# Patient Record
Sex: Male | Born: 2000 | Race: Black or African American | Hispanic: No | Marital: Single
Health system: Southern US, Community
[De-identification: ages and names within clinical notes are randomized; demographics above are authoritative.]

## PROBLEM LIST (undated history)

## (undated) ENCOUNTER — Emergency Department (HOSPITAL_BASED_OUTPATIENT_CLINIC_OR_DEPARTMENT_OTHER): Admission: EM | Source: Home / Self Care

## (undated) DIAGNOSIS — Z973 Presence of spectacles and contact lenses: Secondary | ICD-10-CM

## (undated) DIAGNOSIS — D649 Anemia, unspecified: Secondary | ICD-10-CM

## (undated) HISTORY — DX: Anemia, unspecified: D64.9

## (undated) HISTORY — DX: Presence of spectacles and contact lenses: Z97.3

---
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
## 2023-05-02 ENCOUNTER — Emergency Department (HOSPITAL_BASED_OUTPATIENT_CLINIC_OR_DEPARTMENT_OTHER): Payer: Commercial Managed Care - PPO

## 2023-05-02 ENCOUNTER — Emergency Department (HOSPITAL_BASED_OUTPATIENT_CLINIC_OR_DEPARTMENT_OTHER)
Admission: EM | Admit: 2023-05-02 | Discharge: 2023-05-03 | Disposition: A | Discharge status: Home / Self Care | Payer: Commercial Managed Care - PPO | Attending: Emergency Medicine | Admitting: Emergency Medicine

## 2023-05-02 ENCOUNTER — Other Ambulatory Visit: Payer: Self-pay

## 2023-05-02 DIAGNOSIS — R7309 Other abnormal glucose: Secondary | ICD-10-CM | POA: Insufficient documentation

## 2023-05-02 DIAGNOSIS — R0602 Shortness of breath: Secondary | ICD-10-CM | POA: Insufficient documentation

## 2023-05-02 DIAGNOSIS — R6883 Chills (without fever): Secondary | ICD-10-CM | POA: Insufficient documentation

## 2023-05-02 DIAGNOSIS — Z1152 Encounter for screening for COVID-19: Secondary | ICD-10-CM | POA: Insufficient documentation

## 2023-05-02 DIAGNOSIS — J029 Acute pharyngitis, unspecified: Secondary | ICD-10-CM | POA: Diagnosis not present

## 2023-05-02 DIAGNOSIS — R079 Chest pain, unspecified: Secondary | ICD-10-CM | POA: Diagnosis not present

## 2023-05-02 DIAGNOSIS — J111 Influenza due to unidentified influenza virus with other respiratory manifestations: Secondary | ICD-10-CM

## 2023-05-02 LAB — CBC
HCT: 41.1 % (ref 39.0–52.0)
Hemoglobin: 13.2 g/dL (ref 13.0–17.0)
MCH: 26.3 pg (ref 26.0–34.0)
MCHC: 32.1 g/dL (ref 30.0–36.0)
MCV: 82 fL (ref 80.0–100.0)
Platelets: 212 10*3/uL (ref 150–400)
RBC: 5.01 MIL/uL (ref 4.22–5.81)
RDW: 12.5 % (ref 11.5–15.5)
WBC: 11.2 10*3/uL — ABNORMAL HIGH (ref 4.0–10.5)
nRBC: 0 % (ref 0.0–0.2)

## 2023-05-02 LAB — BASIC METABOLIC PANEL
Anion gap: 8 (ref 5–15)
BUN: 8 mg/dL (ref 6–20)
CO2: 26 mmol/L (ref 22–32)
Calcium: 9.4 mg/dL (ref 8.9–10.3)
Chloride: 103 mmol/L (ref 98–111)
Creatinine, Ser: 1.02 mg/dL (ref 0.61–1.24)
GFR, Estimated: >60 mL/min (ref >60)
Glucose, Bld: 114 mg/dL — ABNORMAL HIGH (ref 70–99)
Potassium: 3.5 mmol/L (ref 3.5–5.1)
Sodium: 137 mmol/L (ref 135–145)

## 2023-05-02 NOTE — Progress Notes (Signed)
Patient seen in triage. BBS CTA with NAD. Patient complains of diaphragmatic pain with deep breathing. Denies recent strenuous activity. Non smoker. Denies current or past vape history. VSS. CXR in process.

## 2023-05-02 NOTE — ED Triage Notes (Addendum)
Reports tightness in chest that makes it difficult to breathe starting this AM. Reports HA and intermittent numbness in both hands. Denies strenuous exercise or trauma. HX hyperlipidemia and low vitamin D.

## 2023-05-03 LAB — RESP PANEL BY RT-PCR (RSV, FLU A&B, COVID)  RVPGX2
Influenza A by PCR: NEGATIVE
Influenza B by PCR: NEGATIVE
Resp Syncytial Virus by PCR: NEGATIVE
SARS Coronavirus 2 by RT PCR: NEGATIVE

## 2023-05-03 LAB — GROUP A STREP BY PCR: Group A Strep by PCR: NOT DETECTED

## 2023-05-03 MED ORDER — ACETAMINOPHEN 325 MG PO TABS
650.0000 mg | ORAL_TABLET | Freq: Once | ORAL | Status: AC
  Administered 2023-05-03: 650 mg via ORAL
  Filled 2023-05-03: qty 2

## 2023-05-03 MED ORDER — DEXAMETHASONE 4 MG PO TABS
10.0000 mg | ORAL_TABLET | Freq: Once | ORAL | Status: AC
  Administered 2023-05-03: 10 mg via ORAL
  Filled 2023-05-03: qty 3

## 2023-05-03 MED ORDER — IBUPROFEN 400 MG PO TABS
400.0000 mg | ORAL_TABLET | Freq: Once | ORAL | Status: AC
  Administered 2023-05-03: 400 mg via ORAL
  Filled 2023-05-03: qty 1

## 2023-05-03 NOTE — Discharge Instructions (Signed)
Drink plenty of fluids.  Take ibuprofen and/or acetaminophen as needed for pain.  Please be aware that if you can combine ibuprofen and acetaminophen, you will get better pain relief and you get from taking either medication by itself.

## 2023-05-03 NOTE — ED Provider Notes (Signed)
[place] [facility]PARTMENT AT [facility] Provider Note   CSN: [id number] Arrival date & time: 05/02/23  2028     History  Chief Complaint  Patient presents with   Shortness of Breath    David Marsh is a 22 y.o. male.  The history is provided by the patient.  Shortness of Breath He woke up this morning and noted a sore throat and some pain across his lower chest which would hurt when he took his breath or if he twisted or turned.  He denies any actual dyspnea.  There has been minimal cough.  He has had some chills but no fever or sweats.  He is also complaining of some aching in his legs.  He had been at a party and ate dinner over the last 3 days but there were no known definite sick contacts.   Home Medications Prior to Admission medications   Medication Sig Start Date End Date Taking? Authorizing Provider  HYDROcodone-acetaminophen (HYCET) 7.5-325 mg/15 ml solution Take 10 mLs by mouth every 4 (four) hours as needed for moderate pain. 09/02/16   Sheral Apley, MD  ondansetron (ZOFRAN ODT) 4 MG disintegrating tablet Take 1 tablet (4 mg total) by mouth every 8 (eight) hours as needed for nausea or vomiting. 09/02/16   Sheral Apley, MD      Allergies    Amoxicillin    Review of Systems   Review of Systems  Respiratory:  Positive for shortness of breath.   All other systems reviewed and are negative.   Physical Exam Updated Vital Signs BP 119/75 (BP Location: Left Arm)   Pulse (!) 101   Temp 98 F (36.7 C) (Oral)   Resp 16   SpO2 98%  Physical Exam Vitals and nursing note reviewed.   22 year old male, resting comfortably and in no acute distress. Vital signs are significant for borderline elevated heart rate. Oxygen saturation is 98%, which is normal. Head is normocephalic and atraumatic. PERRLA, EOMI. Oropharynx is mildly erythematous.  There is no pooling of secretions and phonation is normal. Neck is nontender and supple without  adenopathy. Lungs are clear without rales, wheezes, or rhonchi. Chest is moderately tender across the lower rib cage anteriorly.  There is no crepitus. Heart has regular rate and rhythm without murmur. Abdomen is soft, flat, nontender. Extremities have no cyanosis or edema, full range of motion is present. Skin is warm and dry without rash. Neurologic: Mental status is normal, cranial nerves are intact, moves all extremities equally.  ED Results / Procedures / Treatments   Labs (all labs ordered are listed, but only abnormal results are displayed) Labs Reviewed  BASIC METABOLIC PANEL - Abnormal; Notable for the following components:      Result Value   Glucose, Bld 114 (*)    All other components within normal limits  CBC - Abnormal; Notable for the following components:   WBC 11.2 (*)    All other components within normal limits  GROUP A STREP BY PCR  RESP PANEL BY RT-PCR (RSV, FLU A&B, COVID)  [id number]    EKG EKG Interpretation  Date/Time:  Tuesday May 02 2023 20:44:30 EDT Ventricular Rate:  98 PR Interval:  144 QRS Duration: 98 QT Interval:  306 QTC Calculation: 390 R Axis:   104 Text Interpretation: Normal sinus rhythm Possible Right ventricular hypertrophy Nonspecific T wave abnormality Abnormal ECG No previous ECGs available Confirmed by Ernie Avena ([id number]) on 05/02/2023 11:00:12 PM  Radiology DG Chest  Portable 1 View  Result Date: 05/02/2023 CLINICAL DATA:  Shortness of breath. Chest tightness. EXAM: PORTABLE CHEST 1 VIEW COMPARISON:  None Available. FINDINGS: The cardiomediastinal contours are normal. The lungs are clear. Pulmonary vasculature is normal. No consolidation, pleural effusion, or pneumothorax. No acute osseous abnormalities are seen. IMPRESSION: Negative AP view of the chest. Electronically Signed   By: Narda Rutherford M.D.   On: 05/02/2023 21:08    Procedures Procedures    Medications Ordered in ED Medications  dexamethasone (DECADRON) tablet 10 mg  (has no administration in time range)  acetaminophen (TYLENOL) tablet 650 mg (has no administration in time range)  ibuprofen (ADVIL) tablet 400 mg (has no administration in time range)    ED Course/ Medical Decision Making/ A&P                             Medical Decision Making Amount and/or Complexity of Data Reviewed Labs: ordered. Radiology: ordered.  Risk OTC drugs. Prescription drug management.   Sore throat, cough, chest pain, body aches, chills suggestive of viral illness.  Consider streptococcal pharyngitis.  Consider influenza, COVID-19, other viral illnesses.  I have low index of suspicion for coronary artery disease, pulmonary embolism, pneumonia.  I have reviewed and interpreted his electrocardiogram and my interpretation is nonspecific T wave flattening.  Chest x-ray shows no acute process.  Have independently viewed the image, and agree with the radiologist's interpretation.  I have reviewed and interpreted his laboratory tests and my interpretation is borderline leukocytosis which is nonspecific, elevated random glucose level which will need to be followed as an outpatient.  I have ordered PCR testing for strep and respiratory pathogen's.  I have ordered a dose of dexamethasone as well as acetaminophen and ibuprofen.  PCR test for group A strep, RSV, influenza, COVID-19 are all negative.  He did note significant improvement with above-noted treatment.  I am discharging him with instructions to drink plenty of fluids and use over-the-counter NSAIDs and acetaminophen as needed for pain.  Return if symptoms worsen.  Final Clinical Impression(s) / ED Diagnoses Final diagnoses:  Influenza-like illness  Elevated random blood glucose level    Rx / DC Orders ED Discharge Orders     None         Dione Booze, MD 05/03/23 [id number]

## 2024-01-16 ENCOUNTER — Ambulatory Visit (HOSPITAL_COMMUNITY): Payer: Self-pay

## 2024-09-08 ENCOUNTER — Other Ambulatory Visit: Payer: Self-pay

## 2024-12-08 ENCOUNTER — Encounter (HOSPITAL_COMMUNITY): Payer: Self-pay

## 2024-12-08 ENCOUNTER — Ambulatory Visit (HOSPITAL_COMMUNITY)
Admission: EM | Admit: 2024-12-08 | Discharge: 2024-12-08 | Disposition: A | Discharge status: Home / Self Care | Payer: Self-pay | Source: Home / Self Care

## 2024-12-08 DIAGNOSIS — R058 Other specified cough: Secondary | ICD-10-CM

## 2024-12-08 DIAGNOSIS — G44209 Tension-type headache, unspecified, not intractable: Secondary | ICD-10-CM

## 2024-12-08 MED ORDER — PROMETHAZINE-DM 6.25-15 MG/5ML PO SYRP: 5.0000 mL | ORAL_SOLUTION | Freq: Every evening | ORAL | 0 refills | Status: AC | PRN

## 2024-12-08 MED ORDER — DEXAMETHASONE SOD PHOSPHATE PF 10 MG/ML IJ SOLN
10.0000 mg | Freq: Once | INTRAMUSCULAR | Status: AC
  Administered 2024-12-08: 10 mg via INTRAMUSCULAR

## 2024-12-08 MED ORDER — KETOROLAC TROMETHAMINE 30 MG/ML IJ SOLN
INTRAMUSCULAR | Status: AC
  Filled 2024-12-08: qty 1

## 2024-12-08 MED ORDER — KETOROLAC TROMETHAMINE 30 MG/ML IJ SOLN
30.0000 mg | Freq: Once | INTRAMUSCULAR | Status: AC
  Administered 2024-12-08: 30 mg via INTRAMUSCULAR

## 2024-12-08 MED ORDER — ALBUTEROL SULFATE HFA 108 (90 BASE) MCG/ACT IN AERS: 1.0000 | INHALATION_SPRAY | Freq: Four times a day (QID) | RESPIRATORY_TRACT | 0 refills | Status: AC | PRN

## 2024-12-08 NOTE — ED Provider Notes (Signed)
 [facility]    CSN: [id number] Arrival date & time: 12/08/24  1519      History   Chief Complaint Chief Complaint  Patient presents with   Headache    HPI David Marsh is a 23 y.o. male.   Patient presents today with a weeklong history of headache.  He reports that the pain is usually around his eyes but will also sometimes involve the entirety of his head, is rated 8 on a 0-10 pain scale, described as sharp, no alleviating factors identified.  He has been taking Tylenol  without improvement of symptoms and his last dose was yesterday at around 6:45 PM.  He has had a mild cough particularly when he first wakes up in the morning but denies any significant congestion, fever, sore throat, postnasal drainage.  Denies any recent antibiotics.  He denies any significant past medical history including allergies, asthma, COPD, smoking.  He denies any recent medication changes or head injury.  He denies personal or family history of primary headache disorder.  He has had intermittent nausea but denies any associated vomiting, worse headache of his life, visual disturbance, dysarthria, focal weakness.    Past Medical History:  Diagnosis Date   Anemia    Wears glasses     Patient Active Problem List   Diagnosis Date Noted   Femur fracture, left (HCC) 09/01/2016    Past Surgical History:  Procedure Laterality Date   FEMUR IM NAIL Left 09/01/2016   Procedure: INTRAMEDULLARY (IM) NAIL FEMORAL LEFT;  Surgeon: Evalene JONETTA Chancy, MD;  Location: [facility] OR;  Service: Orthopedics;  Laterality: Left;       Home Medications    Prior to Admission medications  Medication Sig Start Date End Date Taking? Authorizing Provider  Acetaminophen  (TYLENOL  8 HOUR PO) Take by mouth.   Yes [provider]  albuterol  (VENTOLIN  HFA) 108 (90 Base) MCG/ACT inhaler Inhale 1-2 puffs into the lungs every 6 (six) hours as needed for wheezing or shortness of breath. 12/08/24  Yes Stasia Somero K,  PA-C  promethazine -dextromethorphan (PROMETHAZINE -DM) 6.25-15 MG/5ML syrup Take 5 mLs by mouth at bedtime as needed for cough. 12/08/24  Yes Naysa Puskas, Rocky POUR, PA-C    Family History Family History  Problem Relation Age of Onset   Hypertension Mother    Lupus Mother    Other Father        unknown   Heart disease Maternal Uncle    Heart disease Maternal Grandmother    Heart disease Maternal Grandfather     Social History Social History[1]   Allergies   Amoxicillin    Review of Systems Review of Systems  Constitutional:  Positive for activity change. Negative for appetite change, fatigue and fever.  HENT:  Negative for congestion, sinus pressure, sneezing and sore throat.   Respiratory:  Positive for cough. Negative for shortness of breath.   Cardiovascular:  Negative for chest pain.  Gastrointestinal:  Positive for nausea. Negative for diarrhea and vomiting.  Neurological:  Positive for headaches. Negative for dizziness and light-headedness.     Physical Exam Triage Vital Signs ED Triage Vitals  Encounter Vitals Group     BP 12/08/24 1556 129/73     Girls Systolic BP Percentile --      Girls Diastolic BP Percentile --      Boys Systolic BP Percentile --      Boys Diastolic BP Percentile --      Pulse Rate 12/08/24 1556 87     Resp 12/08/24 1556  19     Temp 12/08/24 1556 97.9 F (36.6 C)     Temp Source 12/08/24 1556 Oral     SpO2 12/08/24 1556 98 %     Weight 12/08/24 1554 220 lb (99.8 kg)     Height 12/08/24 1554 6' 1 (1.854 m)     Head Circumference --      Peak Flow --      Pain Score 12/08/24 1554 8     Pain Loc --      Pain Education --      Exclude from Growth Chart --    No data found.  Updated Vital Signs BP 129/73 (BP Location: Right Arm)   Pulse 87   Temp 97.9 F (36.6 C) (Oral)   Resp 19   Ht 6' 1 (1.854 m)   Wt 220 lb (99.8 kg)   SpO2 98%   BMI 29.03 kg/m   Visual Acuity Right Eye Distance:   Left Eye Distance:   Bilateral  Distance:    Right Eye Near:   Left Eye Near:    Bilateral Near:     Physical Exam Vitals reviewed.  Constitutional:      General: He is awake.     Appearance: Normal appearance. He is well-developed. He is not ill-appearing.     Comments: Very pleasant male appears stated age in no acute distress sitting comfortably in exam room  HENT:     Head: Normocephalic and atraumatic.     Right Ear: Tympanic membrane, ear canal and external ear normal. No hemotympanum.     Left Ear: Tympanic membrane, ear canal and external ear normal. No hemotympanum.     Nose: Nose normal.     Right Sinus: No maxillary sinus tenderness or frontal sinus tenderness.     Left Sinus: No maxillary sinus tenderness or frontal sinus tenderness.     Mouth/Throat:     Pharynx: Uvula midline. Postnasal drip present. No oropharyngeal exudate or posterior oropharyngeal erythema.  Eyes:     Extraocular Movements: Extraocular movements intact.     Conjunctiva/sclera: Conjunctivae normal.     Pupils: Pupils are equal, round, and reactive to light.  Cardiovascular:     Rate and Rhythm: Normal rate and regular rhythm.     Heart sounds: Normal heart sounds, S1 normal and S2 normal. No murmur heard. Pulmonary:     Effort: Pulmonary effort is normal. No accessory muscle usage or respiratory distress.     Breath sounds: Normal breath sounds. No stridor. No wheezing, rhonchi or rales.     Comments: Clear to auscultation bilaterally Musculoskeletal:     Comments: Strength 5/5 bilateral upper and lower extremities  Neurological:     General: No focal deficit present.     Mental Status: He is alert and oriented to person, place, and time.     Cranial Nerves: Cranial nerves 2-12 are intact.     Motor: Motor function is intact.     Coordination: Coordination is intact.     Gait: Gait is intact.     Comments: Cranial nerves II through XII grossly intact.  No focal neurologic defect on exam.  Psychiatric:        Behavior:  Behavior is cooperative.      UC Treatments / Results  Labs (all labs ordered are listed, but only abnormal results are displayed) Labs Reviewed - No data to display  EKG   Radiology No results found.  Procedures Procedures (including critical care time)  Medications Ordered in UC Medications  ketorolac  (TORADOL ) 30 MG/ML injection 30 mg (30 mg Intramuscular Given 12/08/24 1636)  dexamethasone  (DECADRON ) injection 10 mg (10 mg Intramuscular Given 12/08/24 1636)    Initial Impression / Assessment and Plan / UC Course  I have reviewed the triage vital signs and the nursing notes.  Pertinent labs & imaging results that were available during my care of the patient were reviewed by me and considered in my medical decision making (see chart for details).     Patient is well-appearing, afebrile, nontoxic, nontachycardic.  He denies any significant URI symptoms other than persistent cough.  Suspect he is recovering from a viral URI illness but there is no evidence of an acute infection that would warrant initiation of antibiotics.  Viral testing was deferred as he has been symptomatic for a week.  He was given 10 mg of Decadron  and 30 mg of ketorolac  in clinic with significant improvement of symptoms.  Recommended that he push fluids and use acetaminophen  for additional pain relief for the rest of today but then starting tomorrow (24 hours after the ketorolac  dose) he can alternate Tylenol  and ibuprofen  as needed.  I suspect his ongoing cough is related to persistent viral cough syndrome and so he was given albuterol  to have on hand for coughing fits and Promethazine  DM for nocturnal cough.  We discussed that this medication can be sedating and he is not to drive or drink alcohol with taking it.  Chest x-ray was deferred as he no adventitious lung sounds on exam as oxygen saturation was 98%.  We discussed that if his symptoms are not improving within a few days he should return for  reevaluation.  If anything worsens and he has high fever, worsening cough, shortness of breath, severe headache, worsening of his life, visual disturbance he needs to be seen emergently.  Strict turn precautions given.  Excuse note provided.  Patient expressed understanding and agreement to treatment plan.  Final Clinical Impressions(s) / UC Diagnoses   Final diagnoses:  Post-viral cough syndrome  Tension headache     Discharge Instructions      I am glad that you are feeling better after the medication.  Since we gave you an injection of Toradol , please do not take NSAIDs (aspirin, ibuprofen /Advil , naproxen/Aleve) for the next 24 hours.  You can use Tylenol /acetaminophen  as needed.  I have called in an inhaler to have on hand.  If you are having to use this on a regular basis please return for reevaluation.  Take Promethazine  DM for cough.  This should help you sleep but it will make you sleepy so do not drive or drink alcohol with taking it.  If you are not feeling better in 3 to 5 days please return for reevaluation.  If anything worsens you have severe headache, worst headache of your life, worsening cough, shortness of breath, fever, chest pain, weakness you need to be seen immediately.     ED Prescriptions     Medication Sig Dispense Auth. Provider   albuterol  (VENTOLIN  HFA) 108 (90 Base) MCG/ACT inhaler Inhale 1-2 puffs into the lungs every 6 (six) hours as needed for wheezing or shortness of breath. 18 g Asaad Gulley K, PA-C   promethazine -dextromethorphan (PROMETHAZINE -DM) 6.25-15 MG/5ML syrup Take 5 mLs by mouth at bedtime as needed for cough. 35 mL Marios Gaiser K, PA-C      PDMP not reviewed this encounter.     [1]  Social History Tobacco Use  Smoking status: Passive Smoke Exposure - Never Smoker   Smokeless tobacco: Never   Tobacco comments:    mother smokes  Vaping Use   Vaping status: Never Used  Substance Use Topics   Alcohol use: Yes    Comment: occ   Drug  use: No     Sherrell Rocky POUR, PA-C 12/08/24 1731

## 2024-12-08 NOTE — Discharge Instructions (Signed)
 I am glad that you are feeling better after the medication.  Since we gave you an injection of Toradol , please do not take NSAIDs (aspirin, ibuprofen /Advil , naproxen/Aleve) for the next 24 hours.  You can use Tylenol /acetaminophen  as needed.  I have called in an inhaler to have on hand.  If you are having to use this on a regular basis please return for reevaluation.  Take Promethazine  DM for cough.  This should help you sleep but it will make you sleepy so do not drive or drink alcohol with taking it.  If you are not feeling better in 3 to 5 days please return for reevaluation.  If anything worsens you have severe headache, worst headache of your life, worsening cough, shortness of breath, fever, chest pain, weakness you need to be seen immediately.

## 2024-12-08 NOTE — ED Triage Notes (Signed)
 Pt states that he has had a headache x1 week  Pt states that he also has a cough and chest tightness.   Pt states that he has been taking Tylenol .

## 2024-12-10 ENCOUNTER — Encounter (HOSPITAL_BASED_OUTPATIENT_CLINIC_OR_DEPARTMENT_OTHER): Payer: Self-pay | Admitting: *Deleted

## 2024-12-10 ENCOUNTER — Emergency Department (HOSPITAL_BASED_OUTPATIENT_CLINIC_OR_DEPARTMENT_OTHER)
Admission: EM | Admit: 2024-12-10 | Discharge: 2024-12-11 | Disposition: A | Payer: Self-pay | Attending: Emergency Medicine | Admitting: Emergency Medicine

## 2024-12-10 ENCOUNTER — Emergency Department (HOSPITAL_BASED_OUTPATIENT_CLINIC_OR_DEPARTMENT_OTHER): Payer: Self-pay | Admitting: Radiology

## 2024-12-10 ENCOUNTER — Other Ambulatory Visit: Payer: Self-pay

## 2024-12-10 MED ORDER — DIPHENHYDRAMINE HCL 50 MG/ML IJ SOLN
12.5000 mg | Freq: Once | INTRAMUSCULAR | Status: AC
  Administered 2024-12-11: 12.5 mg via INTRAVENOUS
  Filled 2024-12-10: qty 1

## 2024-12-10 MED ORDER — PROCHLORPERAZINE EDISYLATE 10 MG/2ML IJ SOLN
10.0000 mg | Freq: Once | INTRAMUSCULAR | Status: AC
  Administered 2024-12-11: 10 mg via INTRAVENOUS
  Filled 2024-12-10: qty 2

## 2024-12-10 MED ORDER — KETOROLAC TROMETHAMINE 30 MG/ML IJ SOLN
30.0000 mg | Freq: Once | INTRAMUSCULAR | Status: AC
  Administered 2024-12-11: 30 mg via INTRAVENOUS
  Filled 2024-12-10: qty 1

## 2024-12-10 NOTE — ED Triage Notes (Addendum)
 Pt is ambulatory with steady gait, reporting he has had a headache for about a week mostly constant but today it faded away, went to work today and it got worse. Last tylenol  5pm. Coughing at night that wakes (for a year) him up at night and chest tightness (around a year). No n/v, change in vision, or fevers.   Seen at [facility] on Sunday, treated for headache, dx with viral process.

## 2024-12-11 NOTE — Discharge Instructions (Signed)
 You were seen today for headache.  Could be related to some early upper respiratory symptoms or sinus pressure.  Try Flonase or nasal saline.  Continue Tylenol  or ibuprofen  as needed.

## 2024-12-11 NOTE — ED Provider Notes (Signed)
 [facility] Provider Note   CSN: [id number] Arrival date & time: 12/10/24  8079     Patient presents with: Headache   David Marsh is a 23 y.o. male.   HPI     This is a 23 year old male who presents with concern for headache.  Patient reports 1 week of headache.  Patient states that it has been constant but waxes and wanes in intensity.  It is mostly behind his eyes.  Took Tylenol  with some relief.  Denies any vision changes, weakness, numbness, strokelike symptoms.  No history of migraines.  Does report some recent upper respiratory symptoms and sinus pressure.  Of note, patient also reports a chronic cough up to a year.  He has not had any fevers.  Prior to Admission medications  Medication Sig Start Date End Date Taking? Authorizing Provider  Acetaminophen  (TYLENOL  8 HOUR PO) Take by mouth.    [provider]  albuterol  (VENTOLIN  HFA) 108 (90 Base) MCG/ACT inhaler Inhale 1-2 puffs into the lungs every 6 (six) hours as needed for wheezing or shortness of breath. 12/08/24   Raspet, Erin K, PA-C  promethazine -dextromethorphan (PROMETHAZINE -DM) 6.25-15 MG/5ML syrup Take 5 mLs by mouth at bedtime as needed for cough. 12/08/24   Raspet, Erin K, PA-C    Allergies: Amoxicillin     Review of Systems  Constitutional:  Negative for fever.  Respiratory:  Positive for cough and chest tightness.   Cardiovascular:  Negative for chest pain.  Neurological:  Positive for headaches. Negative for dizziness, weakness and numbness.  All other systems reviewed and are negative.   Updated Vital Signs BP (!) 137/55 (BP Location: Right Arm)   Pulse 64   Temp 98.7 F (37.1 C) (Oral)   Resp 16   SpO2 100%   Physical Exam Vitals and nursing note reviewed.  Constitutional:      Appearance: He is well-developed.  HENT:     Head: Normocephalic and atraumatic.  Eyes:     Pupils: Pupils are equal, round, and reactive to light.   Cardiovascular:     Rate and Rhythm: Normal rate and regular rhythm.     Heart sounds: Normal heart sounds. No murmur heard. Pulmonary:     Effort: Pulmonary effort is normal. No respiratory distress.     Breath sounds: Normal breath sounds. No wheezing.  Abdominal:     General: Bowel sounds are normal.     Palpations: Abdomen is soft.     Tenderness: There is no abdominal tenderness. There is no rebound.  Musculoskeletal:     Cervical back: Neck supple.  Lymphadenopathy:     Cervical: No cervical adenopathy.  Skin:    General: Skin is warm and dry.  Neurological:     Mental Status: He is alert and oriented to person, place, and time.     Comments: Cranial nerves II through XII intact, 5 out of 5 strength in all 4 extremities, with no dysmetria to finger-nose-finger     (all labs ordered are listed, but only abnormal results are displayed) Labs Reviewed - No data to display  EKG: EKG Interpretation Date/Time:  Tuesday December 10 2024 19:32:19 EST Ventricular Rate:  85 PR Interval:  134 QRS Duration:  120 QT Interval:  356 QTC Calculation: 423 R Axis:   94  Text Interpretation: Normal sinus rhythm Right bundle branch block Abnormal ECG When compared with ECG of 02-May-2023 20:44, Right bundle branch block is now Present Confirmed by Bari Pfeiffer ([id number])  on 12/10/2024 11:19:52 PM  Radiology: DG Chest 2 View Result Date: 12/10/2024 EXAM: 2 VIEW(S) XRAY OF THE CHEST 12/10/2024 07:52:00 PM COMPARISON: Comparison with 05/02/2023. CLINICAL HISTORY: cough and cp for a year FINDINGS: LUNGS AND PLEURA: No focal pulmonary opacity. No pleural effusion. No pneumothorax. HEART AND MEDIASTINUM: No acute abnormality of the cardiac and mediastinal silhouettes. BONES AND SOFT TISSUES: No acute osseous abnormality. IMPRESSION: 1. No acute process. Electronically signed by: Elsie Gravely MD 12/10/2024 07:57 PM EST RP Workstation: [id number]     Procedures   Medications Ordered in  the ED  ketorolac  (TORADOL ) 30 MG/ML injection 30 mg (30 mg Intravenous Given 12/11/24 0006)  prochlorperazine  (COMPAZINE ) injection 10 mg (10 mg Intravenous Given 12/11/24 0009)  diphenhydrAMINE  (BENADRYL ) injection 12.5 mg (12.5 mg Intravenous Given 12/11/24 0006)                                    Medical Decision Making Amount and/or Complexity of Data Reviewed Radiology: ordered.  Risk Prescription drug management.   This patient presents to the ED for concern of headache, this involves an extensive number of treatment options, and is a complaint that carries with it a high risk of complications and morbidity.  I considered the following differential and admission for this acute, potentially life threatening condition.  The differential diagnosis includes tension headache, sinus headache, migraine, less likely subarachnoid hemorrhage or meningitis  MDM:    This is a 23 year old male who presents with headache.  Nontoxic and vital signs are reassuring.  Neurologically intact.  No red flags.  Patient given a migraine cocktail.  Regarding chest pain, EKG and chest x-ray are reassuring.  No evidence of arrhythmia or ischemia.  Chest x-ray without pneumothorax or pneumonia.  Headache could be related to sinus pressure and upper respiratory symptoms.  Recommend supportive measures.  (Labs, imaging, consults)  Labs: I Ordered, and personally interpreted labs.  The pertinent results include: N/A  Imaging Studies ordered: I ordered imaging studies including N/A I independently visualized and interpreted imaging. I agree with the radiologist interpretation  Additional history obtained from chart review.  External records from outside source obtained and reviewed including prior evaluations  Cardiac Monitoring: The patient was maintained on a cardiac monitor.  If on the cardiac monitor, I personally viewed and interpreted the cardiac monitored which showed an underlying rhythm of:  Sinus  Reevaluation: After the interventions noted above, I reevaluated the patient and found that they have :stayed the same  Social Determinants of Health:  lives independently  Disposition: Discharge  Co morbidities that complicate the patient evaluation  Past Medical History:  Diagnosis Date   Anemia    Wears glasses      Medicines Meds ordered this encounter  Medications   ketorolac  (TORADOL ) 30 MG/ML injection 30 mg   prochlorperazine  (COMPAZINE ) injection 10 mg   diphenhydrAMINE  (BENADRYL ) injection 12.5 mg    I have reviewed the patients home medicines and have made adjustments as needed  Problem List / ED Course: Problem List Items Addressed This Visit   None Visit Diagnoses       Acute nonintractable headache, unspecified headache type    -  Primary   Relevant Medications   ketorolac  (TORADOL ) 30 MG/ML injection 30 mg (Completed)                Final diagnoses:  Acute nonintractable headache, unspecified headache type  ED Discharge Orders     None          Leldon Steege, Charmaine FALCON, MD 12/11/24 [phone]
